# Patient Record
Sex: Female | Born: 1960 | Race: White | Hispanic: No | Marital: Single | Smoking: Never smoker
Health system: Southern US, Community
[De-identification: ages and names within clinical notes are randomized; demographics above are authoritative.]

## PROBLEM LIST (undated history)

## (undated) DIAGNOSIS — L570 Actinic keratosis: Secondary | ICD-10-CM

## (undated) DIAGNOSIS — F32A Depression, unspecified: Secondary | ICD-10-CM

## (undated) DIAGNOSIS — E039 Hypothyroidism, unspecified: Secondary | ICD-10-CM

## (undated) DIAGNOSIS — F329 Major depressive disorder, single episode, unspecified: Secondary | ICD-10-CM

## (undated) DIAGNOSIS — E785 Hyperlipidemia, unspecified: Secondary | ICD-10-CM

## (undated) DIAGNOSIS — F419 Anxiety disorder, unspecified: Secondary | ICD-10-CM

## (undated) DIAGNOSIS — M1990 Unspecified osteoarthritis, unspecified site: Secondary | ICD-10-CM

## (undated) HISTORY — DX: Actinic keratosis: L57.0

---

## 2004-12-30 ENCOUNTER — Ambulatory Visit: Payer: Self-pay

## 2005-01-06 ENCOUNTER — Ambulatory Visit: Payer: Self-pay | Admitting: *Deleted

## 2006-01-04 ENCOUNTER — Ambulatory Visit: Payer: Self-pay

## 2007-01-26 ENCOUNTER — Ambulatory Visit: Payer: Self-pay

## 2007-02-02 ENCOUNTER — Ambulatory Visit: Payer: Self-pay

## 2008-02-14 ENCOUNTER — Ambulatory Visit: Payer: Self-pay

## 2008-12-10 ENCOUNTER — Ambulatory Visit: Payer: Self-pay | Admitting: Specialist

## 2009-02-17 ENCOUNTER — Ambulatory Visit: Payer: Self-pay

## 2009-05-26 ENCOUNTER — Emergency Department: Payer: Self-pay | Admitting: Emergency Medicine

## 2010-04-09 ENCOUNTER — Ambulatory Visit: Payer: Self-pay

## 2011-05-12 ENCOUNTER — Ambulatory Visit: Payer: Self-pay

## 2011-08-02 ENCOUNTER — Ambulatory Visit: Payer: Self-pay | Admitting: Gastroenterology

## 2012-06-15 ENCOUNTER — Ambulatory Visit: Payer: Self-pay

## 2013-06-26 ENCOUNTER — Ambulatory Visit: Payer: Self-pay

## 2014-07-16 ENCOUNTER — Ambulatory Visit: Payer: Self-pay

## 2015-07-10 ENCOUNTER — Other Ambulatory Visit: Payer: Self-pay | Admitting: Obstetrics and Gynecology

## 2015-07-10 DIAGNOSIS — Z1231 Encounter for screening mammogram for malignant neoplasm of breast: Secondary | ICD-10-CM

## 2015-07-23 ENCOUNTER — Ambulatory Visit
Admission: RE | Admit: 2015-07-23 | Discharge: 2015-07-23 | Disposition: A | Discharge status: Home / Self Care | Payer: Managed Care, Other (non HMO) | Source: Ambulatory Visit | Attending: Obstetrics and Gynecology | Admitting: Obstetrics and Gynecology

## 2015-07-23 DIAGNOSIS — Z1231 Encounter for screening mammogram for malignant neoplasm of breast: Secondary | ICD-10-CM

## 2016-07-06 ENCOUNTER — Other Ambulatory Visit: Payer: Self-pay | Admitting: Obstetrics and Gynecology

## 2016-07-06 DIAGNOSIS — Z1231 Encounter for screening mammogram for malignant neoplasm of breast: Secondary | ICD-10-CM

## 2016-07-13 ENCOUNTER — Ambulatory Visit
Admission: RE | Admit: 2016-07-13 | Discharge: 2016-07-13 | Disposition: A | Discharge status: Home / Self Care | Payer: Managed Care, Other (non HMO) | Source: Ambulatory Visit | Attending: Otolaryngology | Admitting: Otolaryngology

## 2016-07-13 ENCOUNTER — Other Ambulatory Visit: Payer: Self-pay | Admitting: Otolaryngology

## 2016-07-13 DIAGNOSIS — R05 Cough: Secondary | ICD-10-CM | POA: Diagnosis not present

## 2016-07-13 DIAGNOSIS — R918 Other nonspecific abnormal finding of lung field: Secondary | ICD-10-CM | POA: Diagnosis not present

## 2016-07-13 DIAGNOSIS — R059 Cough, unspecified: Secondary | ICD-10-CM

## 2016-07-20 ENCOUNTER — Ambulatory Visit
Admission: RE | Admit: 2016-07-20 | Discharge: 2016-07-20 | Disposition: A | Discharge status: Home / Self Care | Payer: Managed Care, Other (non HMO) | Source: Ambulatory Visit | Attending: Otolaryngology | Admitting: Otolaryngology

## 2016-07-20 ENCOUNTER — Other Ambulatory Visit: Payer: Self-pay | Admitting: Otolaryngology

## 2016-07-20 DIAGNOSIS — R05 Cough: Secondary | ICD-10-CM | POA: Insufficient documentation

## 2016-07-20 DIAGNOSIS — R059 Cough, unspecified: Secondary | ICD-10-CM

## 2016-07-26 ENCOUNTER — Ambulatory Visit
Admission: RE | Admit: 2016-07-26 | Discharge: 2016-07-26 | Disposition: A | Discharge status: Home / Self Care | Payer: Managed Care, Other (non HMO) | Source: Ambulatory Visit | Attending: Obstetrics and Gynecology | Admitting: Obstetrics and Gynecology

## 2016-07-26 DIAGNOSIS — Z1231 Encounter for screening mammogram for malignant neoplasm of breast: Secondary | ICD-10-CM | POA: Diagnosis not present

## 2016-11-25 ENCOUNTER — Other Ambulatory Visit: Payer: Self-pay | Admitting: Orthopedic Surgery

## 2016-11-25 DIAGNOSIS — S56011A Strain of flexor muscle, fascia and tendon of right thumb at forearm level, initial encounter: Secondary | ICD-10-CM

## 2016-12-06 ENCOUNTER — Ambulatory Visit
Admission: RE | Admit: 2016-12-06 | Discharge: 2016-12-06 | Disposition: A | Discharge status: Home / Self Care | Payer: Managed Care, Other (non HMO) | Source: Ambulatory Visit | Attending: Orthopedic Surgery | Admitting: Orthopedic Surgery

## 2016-12-06 DIAGNOSIS — X58XXXA Exposure to other specified factors, initial encounter: Secondary | ICD-10-CM | POA: Diagnosis not present

## 2016-12-06 DIAGNOSIS — S56011A Strain of flexor muscle, fascia and tendon of right thumb at forearm level, initial encounter: Secondary | ICD-10-CM

## 2016-12-06 DIAGNOSIS — S56211A Strain of other flexor muscle, fascia and tendon at forearm level, right arm, initial encounter: Secondary | ICD-10-CM | POA: Diagnosis not present

## 2017-01-07 ENCOUNTER — Other Ambulatory Visit: Payer: Self-pay | Admitting: Gastroenterology

## 2017-01-07 DIAGNOSIS — R1310 Dysphagia, unspecified: Secondary | ICD-10-CM

## 2017-01-12 ENCOUNTER — Ambulatory Visit: Payer: Managed Care, Other (non HMO)

## 2017-01-14 ENCOUNTER — Ambulatory Visit
Admission: RE | Admit: 2017-01-14 | Discharge: 2017-01-14 | Disposition: A | Discharge status: Home / Self Care | Payer: Managed Care, Other (non HMO) | Source: Ambulatory Visit | Attending: Gastroenterology | Admitting: Gastroenterology

## 2017-01-14 DIAGNOSIS — R1310 Dysphagia, unspecified: Secondary | ICD-10-CM | POA: Insufficient documentation

## 2017-04-07 ENCOUNTER — Encounter: Payer: Self-pay | Admitting: *Deleted

## 2017-04-08 ENCOUNTER — Encounter
Admission: RE | Disposition: A | Discharge status: Home / Self Care | Payer: Self-pay | Source: Ambulatory Visit | Attending: Gastroenterology

## 2017-04-08 ENCOUNTER — Ambulatory Visit: Payer: Managed Care, Other (non HMO) | Admitting: Anesthesiology

## 2017-04-08 ENCOUNTER — Ambulatory Visit
Admission: RE | Admit: 2017-04-08 | Discharge: 2017-04-08 | Disposition: A | Discharge status: Home / Self Care | Payer: Managed Care, Other (non HMO) | Source: Ambulatory Visit | Attending: Gastroenterology | Admitting: Gastroenterology

## 2017-04-08 ENCOUNTER — Encounter: Payer: Self-pay | Admitting: *Deleted

## 2017-04-08 DIAGNOSIS — Z8371 Family history of colonic polyps: Secondary | ICD-10-CM | POA: Insufficient documentation

## 2017-04-08 DIAGNOSIS — E785 Hyperlipidemia, unspecified: Secondary | ICD-10-CM | POA: Diagnosis not present

## 2017-04-08 DIAGNOSIS — Z1211 Encounter for screening for malignant neoplasm of colon: Secondary | ICD-10-CM | POA: Insufficient documentation

## 2017-04-08 DIAGNOSIS — K2970 Gastritis, unspecified, without bleeding: Secondary | ICD-10-CM | POA: Insufficient documentation

## 2017-04-08 DIAGNOSIS — E039 Hypothyroidism, unspecified: Secondary | ICD-10-CM | POA: Diagnosis not present

## 2017-04-08 DIAGNOSIS — R1310 Dysphagia, unspecified: Secondary | ICD-10-CM | POA: Diagnosis not present

## 2017-04-08 DIAGNOSIS — K2210 Ulcer of esophagus without bleeding: Secondary | ICD-10-CM | POA: Diagnosis not present

## 2017-04-08 DIAGNOSIS — Z79899 Other long term (current) drug therapy: Secondary | ICD-10-CM | POA: Diagnosis not present

## 2017-04-08 HISTORY — DX: Hyperlipidemia, unspecified: E78.5

## 2017-04-08 HISTORY — DX: Depression, unspecified: F32.A

## 2017-04-08 HISTORY — DX: Unspecified osteoarthritis, unspecified site: M19.90

## 2017-04-08 HISTORY — DX: Anxiety disorder, unspecified: F41.9

## 2017-04-08 HISTORY — PX: COLONOSCOPY WITH PROPOFOL: SHX5780

## 2017-04-08 HISTORY — DX: Hypothyroidism, unspecified: E03.9

## 2017-04-08 HISTORY — PX: ESOPHAGOGASTRODUODENOSCOPY (EGD) WITH PROPOFOL: SHX5813

## 2017-04-08 HISTORY — DX: Major depressive disorder, single episode, unspecified: F32.9

## 2017-04-08 SURGERY — COLONOSCOPY WITH PROPOFOL
Anesthesia: General

## 2017-04-08 MED ORDER — SODIUM CHLORIDE 0.9 % IV SOLN
INTRAVENOUS | Status: DC
  Administered 2017-04-08 (×2): via INTRAVENOUS

## 2017-04-08 MED ORDER — MIDAZOLAM HCL 2 MG/2ML IJ SOLN
INTRAMUSCULAR | Status: DC | PRN
  Administered 2017-04-08: 2 mg via INTRAVENOUS

## 2017-04-08 MED ORDER — PHENYLEPHRINE HCL 10 MG/ML IJ SOLN
INTRAMUSCULAR | Status: DC | PRN
  Administered 2017-04-08: 150 ug via INTRAVENOUS
  Administered 2017-04-08 (×4): 100 ug via INTRAVENOUS
  Administered 2017-04-08: 50 ug via INTRAVENOUS
  Administered 2017-04-08 (×2): 100 ug via INTRAVENOUS

## 2017-04-08 MED ORDER — MIDAZOLAM HCL 2 MG/2ML IJ SOLN
INTRAMUSCULAR | Status: AC
  Filled 2017-04-08: qty 2

## 2017-04-08 MED ORDER — SODIUM CHLORIDE 0.9 % IV SOLN
INTRAVENOUS | Status: DC
Start: 2017-04-08 — End: 2017-04-08

## 2017-04-08 MED ORDER — PROPOFOL 10 MG/ML IV BOLUS
INTRAVENOUS | Status: DC | PRN
  Administered 2017-04-08: 400 mg via INTRAVENOUS

## 2017-04-08 MED ORDER — FENTANYL CITRATE (PF) 100 MCG/2ML IJ SOLN
INTRAMUSCULAR | Status: AC
Start: 2017-04-08 — End: 2017-04-08
  Filled 2017-04-08: qty 2

## 2017-04-08 MED ORDER — PROPOFOL 500 MG/50ML IV EMUL
INTRAVENOUS | Status: DC | PRN
  Administered 2017-04-08: 150 ug/kg/min via INTRAVENOUS

## 2017-04-08 MED ORDER — PROPOFOL 10 MG/ML IV BOLUS
INTRAVENOUS | Status: AC
  Filled 2017-04-08: qty 20

## 2017-04-08 MED ORDER — FENTANYL CITRATE (PF) 100 MCG/2ML IJ SOLN
INTRAMUSCULAR | Status: DC | PRN
  Administered 2017-04-08 (×2): 50 ug via INTRAVENOUS

## 2017-04-08 MED ORDER — IPRATROPIUM-ALBUTEROL 0.5-2.5 (3) MG/3ML IN SOLN
3.0000 mL | Freq: Once | RESPIRATORY_TRACT | Status: AC
  Administered 2017-04-08: 3 mL via RESPIRATORY_TRACT
  Filled 2017-04-08: qty 3

## 2017-04-08 NOTE — Anesthesia Post-op Follow-up Note (Cosign Needed)
Anesthesia QCDR form completed.        

## 2017-04-08 NOTE — Op Note (Signed)
[facility] Gastroenterology Patient Name: Karina Williams Procedure Date: 04/08/2017 10:26 AM MRN: [id number] Account #: [id number] Date of Birth: April 11, 1961 Admit Type: Outpatient Age: 56 Room: [facility] ENDO ROOM 1 Gender: Female Note Status: Finalized Procedure:            Colonoscopy Indications:          Family history of colonic polyps in a first-degree                        relative Providers:            Lollie Sails, MD Referring MD:         Dion Body (Referring MD) Medicines:            Monitored Anesthesia Care Complications:        No immediate complications. Procedure:            Pre-Anesthesia Assessment:                       - ASA Grade Assessment: II - A patient with mild                        systemic disease.                       After obtaining informed consent, the colonoscope was                        passed under direct vision. Throughout the procedure,                        the patient's blood pressure, pulse, and oxygen                        saturations were monitored continuously. The Olympus                        PCF-H180AL colonoscope ( S#: [id number] ) was introduced                        through the anus and advanced to the the cecum,                        identified by appendiceal orifice and ileocecal valve.                        The quality of the bowel preparation was good. The                        colonoscopy was performed without difficulty. The                        patient tolerated the procedure well. Findings:      The colon (entire examined portion) appeared normal.      The retroflexed view of the distal rectum and anal verge was normal and       showed no anal or rectal abnormalities.      The digital rectal exam was normal. Impression:           - The entire examined colon is normal.                       -  The distal rectum and anal verge are normal on                        retroflexion view.                   - No specimens collected. Recommendation:       - Discharge patient to home.                       - Advance diet as tolerated.                       - Repeat colonoscopy in 5 years for screening purposes. Procedure Code(s):    --- Professional ---                       [phone], Colonoscopy, flexible; diagnostic, including                        collection of specimen(s) by brushing or washing, when                        performed (separate procedure) Diagnosis Code(s):    --- Professional ---                       Z83.71, Family history of colonic polyps CPT copyright 2016 American Medical Association. All rights reserved. The codes documented in this report are preliminary and upon coder review may  be revised to meet current compliance requirements. Lollie Sails, MD 04/08/2017 11:25:46 AM This report has been signed electronically. Number of Addenda: 0 Note Initiated On: 04/08/2017 10:26 AM Scope Withdrawal Time: 0 hours 7 minutes 51 seconds  Total Procedure Duration: 0 hours 15 minutes 36 seconds       [facility]

## 2017-04-08 NOTE — Anesthesia Preprocedure Evaluation (Addendum)
Anesthesia Evaluation  Patient identified by MRN, date of birth, ID band Patient awake    Reviewed: Allergy & Precautions, H&P , NPO status , Patient's Chart, lab work & pertinent test results  History of Anesthesia Complications Negative for: history of anesthetic complications  Airway Mallampati: III  TM Distance: >3 FB Neck ROM: full    Dental  (+) Caps, Chipped   Pulmonary neg pulmonary ROS, neg shortness of breath,    Pulmonary exam normal breath sounds clear to auscultation       Cardiovascular Exercise Tolerance: Good (-) angina(-) Past MI and (-) DOE negative cardio ROS Normal cardiovascular exam Rhythm:regular Rate:Normal     Neuro/Psych PSYCHIATRIC DISORDERS Anxiety Depression negative neurological ROS  negative psych ROS   GI/Hepatic Neg liver ROS, GERD  Controlled,  Endo/Other  Hypothyroidism   Renal/GU negative Renal ROS  negative genitourinary   Musculoskeletal  (+) Arthritis ,   Abdominal   Peds  Hematology negative hematology ROS (+)   Anesthesia Other Findings Past Medical History: No date: Anxiety No date: Arthritis No date: Depression No date: Hyperlipidemia No date: Hypothyroidism  History reviewed. No pertinent surgical history.  BMI    Body Mass Index:  21.56 kg/m      Reproductive/Obstetrics negative OB ROS                            Anesthesia Physical Anesthesia Plan  ASA: III  Anesthesia Plan: General   Post-op Pain Management:    Induction:   Airway Management Planned:   Additional Equipment:   Intra-op Plan:   Post-operative Plan:   Informed Consent: I have reviewed the patients History and Physical, chart, labs and discussed the procedure including the risks, benefits and alternatives for the proposed anesthesia with the patient or authorized representative who has indicated his/her understanding and acceptance.   Dental Advisory  Given  Plan Discussed with: Anesthesiologist, CRNA and Surgeon  Anesthesia Plan Comments:         Anesthesia Quick Evaluation

## 2017-04-08 NOTE — Anesthesia Postprocedure Evaluation (Signed)
Anesthesia Post Note  Patient: Karina Williams  Procedure(s) Performed: Procedure(s) (LRB): COLONOSCOPY WITH PROPOFOL (N/A) ESOPHAGOGASTRODUODENOSCOPY (EGD) WITH PROPOFOL (N/A)  Patient location during evaluation: Endoscopy Anesthesia Type: General Level of consciousness: awake and alert Pain management: pain level controlled Vital Signs Assessment: post-procedure vital signs reviewed and stable Respiratory status: spontaneous breathing, nonlabored ventilation, respiratory function stable and patient connected to nasal cannula oxygen Cardiovascular status: blood pressure returned to baseline and stable Postop Assessment: no signs of nausea or vomiting Anesthetic complications: no     Last Vitals:  Vitals:   04/08/17 1152 04/08/17 1202  BP: 91/62 (!) 107/52  Pulse: 69 64  Resp: (!) 22 17  Temp:      Last Pain:  Vitals:   04/08/17 1132  TempSrc:   PainSc: 0-No pain                 Precious Haws Piscitello

## 2017-04-08 NOTE — H&P (Signed)
Outpatient short stay form Pre-procedure 04/08/2017 10:25 AM Karina Sails MD  Primary Physician: Dr Dion Body  Reason for visit:  EGD and colonoscopy  History of present illness:  Patient is a 56 year old female presenting today as above. There is family history of colon polyps and a primary relative. She has had a colonoscopy which was negative for this in the past. She also has some issues with dysphagia. This is more so a globus sensation. She has had a barium swallow done on 01/14/2017 that showed the cervical esophagus to be widely patent thoracic esophagus is widely patent no evidence of reflux or focal abnormality and a standardized tablet passed without hang up. She states that she may have some anxiety at times. He does not take medications for this. He tolerated her prep well. She takes no aspirin or blood thinning agents. She states she has had some difficulty lately with getting her thyroid medications adjusted.    Current Facility-Administered Medications:  .  0.9 %  sodium chloride infusion, , Intravenous, Continuous, Karina Sails, MD, Last Rate: 20 mL/hr at 04/08/17 1021 .  0.9 %  sodium chloride infusion, , Intravenous, Continuous, Karina Sails, MD  Prescriptions Prior to Admission  Medication Sig Dispense Refill Last Dose  . atorvastatin (LIPITOR) 10 MG tablet Take 10 mg by mouth daily.   04/07/2017 at Unknown time  . levothyroxine (SYNTHROID, LEVOTHROID) 88 MCG tablet Take 88 mcg by mouth daily before breakfast.   04/07/2017 at Unknown time  . Misc Natural Products (GLUCOSAMINE-CHONDROITIN SULF PO) Take by mouth.   04/07/2017 at Unknown time  . Multiple Vitamin (MULTIVITAMIN) tablet Take 1 tablet by mouth daily.   Past Week at Unknown time  . albuterol (PROVENTIL HFA;VENTOLIN HFA) 108 (90 Base) MCG/ACT inhaler Inhale 2 puffs into the lungs every 4 (four) hours as needed for wheezing or shortness of breath.   Not Taking at Unknown time  . azelastine  (ASTELIN) 0.1 % nasal spray Place 2 sprays into both nostrils Nightly. Use in each nostril as directed   Not Taking at Unknown time  . estradiol (ESTRACE) 1 MG tablet Take 1 mg by mouth daily.   Not Taking at Unknown time  . medroxyPROGESTERone (PROVERA) 5 MG tablet Take 5 mg by mouth daily.   Not Taking at Unknown time  . ranitidine (ZANTAC) 150 MG tablet Take 150 mg by mouth at bedtime.   Not Taking at Unknown time     No Known Allergies   Past Medical History:  Diagnosis Date  . Anxiety   . Arthritis   . Depression   . Hyperlipidemia   . Hypothyroidism     Review of systems:      Physical Exam    Heart and lungs: Regular rate and rhythm without rub or gallop, lungs are bilaterally clear.    HEENT: Normocephalic atraumatic eyes are anicteric    Other:     Pertinant exam for procedure: Soft nontender nondistended bowel sounds positive normoactive.   Planned proceedures: Colonoscopy and indicated procedures. I have discussed the risks benefits and complications of procedures to include not limited to bleeding, infection, perforation and the risk of sedation and the patient wishes to proceed.    Karina Sails, MD Gastroenterology 04/08/2017  10:25 AM

## 2017-04-08 NOTE — Addendum Note (Signed)
Addendum  created 04/08/17 1333 by Andria Frames, MD   Anesthesia Attestations filed

## 2017-04-08 NOTE — Op Note (Signed)
[facility] Gastroenterology Patient Name: Karina Williams Procedure Date: 04/08/2017 10:26 AM MRN: [id number] Account #: [id number] Date of Birth: [birth year] Admit Type: Outpatient Age: 56 Room: [facility] ENDO ROOM 1 Gender: Female Note Status: Finalized Procedure:            Upper GI endoscopy Indications:          Dysphagia Providers:            Lollie Sails, MD Referring MD:         Dion Body (Referring MD) Medicines:            Monitored Anesthesia Care Complications:        No immediate complications. Procedure:            Pre-Anesthesia Assessment:                       - ASA Grade Assessment: II - A patient with mild                        systemic disease.                       After obtaining informed consent, the endoscope was                        passed under direct vision. Throughout the procedure,                        the patient's blood pressure, pulse, and oxygen                        saturations were monitored continuously. The Endoscope                        was introduced through the mouth, and advanced to the                        third part of duodenum. The upper GI endoscopy was                        accomplished without difficulty. The patient tolerated                        the procedure well. Findings:      LA Grade B (one or more mucosal breaks greater than 5 mm, not extending       between the tops of two mucosal folds) esophagitis with no bleeding was       found.      The exam of the esophagus was otherwise normal.      Diffuse mildly erythematous mucosa without bleeding was found in the       gastric body. Biopsies were taken with a cold forceps for histology.      The cardia and gastric fundus were normal on retroflexion.      The examined duodenum was normal. Impression:           - LA Grade B erosive esophagitis.                       - Erythematous mucosa in the gastric body. Biopsied.                       -  Normal examined duodenum. Recommendation:       - Perform a colonoscopy today.                       - Use Protonix (pantoprazole) 40 mg PO daily for 3                        months. Procedure Code(s):    --- Professional ---                       [phone], Esophagogastroduodenoscopy, flexible, transoral;                        with biopsy, single or multiple Diagnosis Code(s):    --- Professional ---                       K20.8, Other esophagitis                       K31.89, Other diseases of stomach and duodenum                       R13.10, Dysphagia, unspecified CPT copyright 2016 American Medical Association. All rights reserved. The codes documented in this report are preliminary and upon coder review may  be revised to meet current compliance requirements. Lollie Sails, MD 04/08/2017 10:58:31 AM This report has been signed electronically. Number of Addenda: 0 Note Initiated On: 04/08/2017 10:26 AM      [facility]

## 2017-04-08 NOTE — Transfer of Care (Signed)
Immediate Anesthesia Transfer of Care Note  Patient: Karina Williams  Procedure(s) Performed: Procedure(s): COLONOSCOPY WITH PROPOFOL (N/A) ESOPHAGOGASTRODUODENOSCOPY (EGD) WITH PROPOFOL (N/A)  Patient Location: PACU  Anesthesia Type:General  Level of Consciousness: awake  Airway & Oxygen Therapy: Patient Spontanous Breathing and Patient connected to nasal cannula oxygen  Post-op Assessment: Report given to RN and Post -op Vital signs reviewed and stable  Post vital signs: Reviewed and stable  Last Vitals:  Vitals:   04/08/17 0957  BP: 111/70  Resp: 16  Temp: 36.3 C    Last Pain:  Vitals:   04/08/17 0957  TempSrc: Tympanic         Complications: No apparent anesthesia complications

## 2017-04-11 ENCOUNTER — Encounter: Payer: Self-pay | Admitting: Gastroenterology

## 2017-04-11 LAB — SURGICAL PATHOLOGY

## 2017-08-10 ENCOUNTER — Other Ambulatory Visit: Payer: Self-pay | Admitting: Obstetrics and Gynecology

## 2017-08-10 DIAGNOSIS — Z1231 Encounter for screening mammogram for malignant neoplasm of breast: Secondary | ICD-10-CM

## 2017-08-25 ENCOUNTER — Ambulatory Visit
Admission: RE | Admit: 2017-08-25 | Discharge: 2017-08-25 | Disposition: A | Discharge status: Home / Self Care | Payer: Managed Care, Other (non HMO) | Source: Ambulatory Visit | Attending: Obstetrics and Gynecology | Admitting: Obstetrics and Gynecology

## 2017-08-25 DIAGNOSIS — Z1231 Encounter for screening mammogram for malignant neoplasm of breast: Secondary | ICD-10-CM | POA: Insufficient documentation

## 2018-07-24 ENCOUNTER — Other Ambulatory Visit: Payer: Self-pay | Admitting: Obstetrics and Gynecology

## 2018-07-24 DIAGNOSIS — Z1231 Encounter for screening mammogram for malignant neoplasm of breast: Secondary | ICD-10-CM

## 2018-09-15 ENCOUNTER — Ambulatory Visit
Admission: RE | Admit: 2018-09-15 | Discharge: 2018-09-15 | Disposition: A | Discharge status: Home / Self Care | Payer: Managed Care, Other (non HMO) | Source: Ambulatory Visit | Attending: Obstetrics and Gynecology | Admitting: Obstetrics and Gynecology

## 2018-09-15 DIAGNOSIS — Z1231 Encounter for screening mammogram for malignant neoplasm of breast: Secondary | ICD-10-CM | POA: Diagnosis present

## 2019-07-26 ENCOUNTER — Other Ambulatory Visit: Payer: Self-pay | Admitting: Obstetrics and Gynecology

## 2019-07-26 DIAGNOSIS — Z1231 Encounter for screening mammogram for malignant neoplasm of breast: Secondary | ICD-10-CM

## 2019-09-19 ENCOUNTER — Ambulatory Visit
Admission: RE | Admit: 2019-09-19 | Discharge: 2019-09-19 | Disposition: A | Discharge status: Home / Self Care | Payer: 59 | Source: Ambulatory Visit | Attending: Obstetrics and Gynecology | Admitting: Obstetrics and Gynecology

## 2019-09-19 DIAGNOSIS — Z1231 Encounter for screening mammogram for malignant neoplasm of breast: Secondary | ICD-10-CM | POA: Insufficient documentation

## 2020-07-16 ENCOUNTER — Ambulatory Visit (INDEPENDENT_AMBULATORY_CARE_PROVIDER_SITE_OTHER): Payer: 59 | Admitting: Dermatology

## 2020-07-16 ENCOUNTER — Other Ambulatory Visit: Payer: Self-pay

## 2020-07-16 DIAGNOSIS — D229 Melanocytic nevi, unspecified: Secondary | ICD-10-CM | POA: Diagnosis not present

## 2020-07-16 DIAGNOSIS — D1800 Hemangioma unspecified site: Secondary | ICD-10-CM | POA: Diagnosis not present

## 2020-07-16 DIAGNOSIS — L814 Other melanin hyperpigmentation: Secondary | ICD-10-CM

## 2020-07-16 DIAGNOSIS — L578 Other skin changes due to chronic exposure to nonionizing radiation: Secondary | ICD-10-CM

## 2020-07-16 DIAGNOSIS — L918 Other hypertrophic disorders of the skin: Secondary | ICD-10-CM

## 2020-07-16 DIAGNOSIS — L821 Other seborrheic keratosis: Secondary | ICD-10-CM

## 2020-07-16 DIAGNOSIS — L738 Other specified follicular disorders: Secondary | ICD-10-CM

## 2020-07-16 DIAGNOSIS — Z1283 Encounter for screening for malignant neoplasm of skin: Secondary | ICD-10-CM

## 2020-07-16 NOTE — Patient Instructions (Addendum)

## 2020-07-16 NOTE — Progress Notes (Signed)
   Follow-Up Visit   Subjective  Karina Williams is a 59 y.o. female who presents for the following: Annual Exam (TBSE, Hx of Aks ). The patient presents for Total-Body Skin Exam (TBSE) for skin cancer screening and mole check.  The following portions of the chart were reviewed this encounter and updated as appropriate:  Tobacco  Allergies  Meds  Problems  Med Hx  Surg Hx  Fam Hx     Review of Systems:  No other skin or systemic complaints except as noted in HPI or Assessment and Plan.  Objective  Well appearing patient in no apparent distress; mood and affect are within normal limits.  A full examination was performed including scalp, head, eyes, ears, nose, lips, neck, chest, axillae, abdomen, back, buttocks, bilateral upper extremities, bilateral lower extremities, hands, feet, fingers, toes, fingernails, and toenails. All findings within normal limits unless otherwise noted below.  Objective  R lower eyelid: Small yellow papules with a central dell.    Assessment & Plan    Lentigines - Scattered tan macules - Discussed due to sun exposure - Benign, observe - Call for any changes  Seborrheic Keratoses - Stuck-on, waxy, tan-brown papules and plaques  - Discussed benign etiology and prognosis. - Observe - Call for any changes  Melanocytic Nevi - Tan-brown and/or pink-flesh-colored symmetric macules and papules - Benign appearing on exam today - Observation - Call clinic for new or changing moles - Recommend daily use of broad spectrum spf 30+ sunscreen to sun-exposed areas.   Hemangiomas - Red papules - Discussed benign nature - Observe - Call for any changes  Actinic Damage - diffuse scaly erythematous macules with underlying dyspigmentation - Recommend daily broad spectrum sunscreen SPF 30+ to sun-exposed areas, reapply every 2 hours as needed.  - Call for new or changing lesions.  Skin cancer screening performed today.  Acrochordons (Skin Tags) -  Fleshy, skin-colored pedunculated papules - Benign appearing.  - Observe. - If desired, they can be removed with an in office procedure that is not covered by insurance. - Please call the clinic if you notice any new or changing lesions.  Sebaceous hyperplasia R lower eyelid  Observe   Skin cancer screening  Return in about 1 year (around 07/16/2021) for TBSE.  IMarye Round, CMA, am acting as scribe for Sarina Ser, MD .  Documentation: I have reviewed the above documentation for accuracy and completeness, and I agree with the above.  Sarina Ser, MD

## 2020-07-19 ENCOUNTER — Encounter: Payer: Self-pay | Admitting: Dermatology

## 2020-08-19 ENCOUNTER — Other Ambulatory Visit: Payer: Self-pay | Admitting: Obstetrics and Gynecology

## 2020-08-19 DIAGNOSIS — Z1231 Encounter for screening mammogram for malignant neoplasm of breast: Secondary | ICD-10-CM

## 2020-09-19 ENCOUNTER — Other Ambulatory Visit: Payer: Self-pay

## 2020-09-19 ENCOUNTER — Ambulatory Visit
Admission: RE | Admit: 2020-09-19 | Discharge: 2020-09-19 | Disposition: A | Discharge status: Home / Self Care | Payer: 59 | Source: Ambulatory Visit | Attending: Obstetrics and Gynecology | Admitting: Obstetrics and Gynecology

## 2020-09-19 DIAGNOSIS — Z1231 Encounter for screening mammogram for malignant neoplasm of breast: Secondary | ICD-10-CM | POA: Diagnosis present

## 2021-07-22 ENCOUNTER — Other Ambulatory Visit: Payer: Self-pay

## 2021-07-22 ENCOUNTER — Encounter: Payer: Self-pay | Admitting: Dermatology

## 2021-07-22 ENCOUNTER — Ambulatory Visit (INDEPENDENT_AMBULATORY_CARE_PROVIDER_SITE_OTHER): Payer: 59 | Admitting: Dermatology

## 2021-07-22 DIAGNOSIS — D225 Melanocytic nevi of trunk: Secondary | ICD-10-CM | POA: Diagnosis not present

## 2021-07-22 DIAGNOSIS — L578 Other skin changes due to chronic exposure to nonionizing radiation: Secondary | ICD-10-CM

## 2021-07-22 DIAGNOSIS — Z1283 Encounter for screening for malignant neoplasm of skin: Secondary | ICD-10-CM | POA: Diagnosis not present

## 2021-07-22 DIAGNOSIS — D229 Melanocytic nevi, unspecified: Secondary | ICD-10-CM

## 2021-07-22 DIAGNOSIS — L814 Other melanin hyperpigmentation: Secondary | ICD-10-CM | POA: Diagnosis not present

## 2021-07-22 DIAGNOSIS — D485 Neoplasm of uncertain behavior of skin: Secondary | ICD-10-CM

## 2021-07-22 DIAGNOSIS — D1800 Hemangioma unspecified site: Secondary | ICD-10-CM

## 2021-07-22 DIAGNOSIS — L821 Other seborrheic keratosis: Secondary | ICD-10-CM

## 2021-07-22 NOTE — Progress Notes (Signed)
   Follow-Up Visit   Subjective  Karina Williams is a 60 y.o. female who presents for the following: Annual Exam (Mole of right inframammary - TBSE today). The patient presents for Total-Body Skin Exam (TBSE) for skin cancer screening and mole check.  The following portions of the chart were reviewed this encounter and updated as appropriate:   Tobacco  Allergies  Meds  Problems  Med Hx  Surg Hx  Fam Hx     Review of Systems:  No other skin or systemic complaints except as noted in HPI or Assessment and Plan.  Objective  Well appearing patient in no apparent distress; mood and affect are within normal limits.  A full examination was performed including scalp, head, eyes, ears, nose, lips, neck, chest, axillae, abdomen, back, buttocks, bilateral upper extremities, bilateral lower extremities, hands, feet, fingers, toes, fingernails, and toenails. All findings within normal limits unless otherwise noted below.  Right Inframammary Fold 1.1 cm brown to flesh colored papule   Assessment & Plan  Neoplasm of uncertain behavior of skin Right Inframammary Fold  Epidermal / dermal shaving  Lesion diameter (cm):  1.1 Informed consent: discussed and consent obtained   Timeout: patient name, date of birth, surgical site, and procedure verified   Procedure prep:  Patient was prepped and draped in usual sterile fashion Prep type:  Isopropyl alcohol Anesthesia: the lesion was anesthetized in a standard fashion   Anesthetic:  1% lidocaine w/ epinephrine 1-100,000 buffered w/ 8.4% NaHCO3 Instrument used: flexible razor blade   Hemostasis achieved with: pressure, aluminum chloride and electrodesiccation   Outcome: patient tolerated procedure well   Post-procedure details: sterile dressing applied and wound care instructions given   Dressing type: bandage and petrolatum    Specimen 1 - Surgical pathology Differential Diagnosis: Irritated nevus R/O dysplasia Check Margins: No  Skin  cancer screening  Lentigines - Scattered tan macules - Due to sun exposure - Benign-appering, observe - Recommend daily broad spectrum sunscreen SPF 30+ to sun-exposed areas, reapply every 2 hours as needed. - Call for any changes  Seborrheic Keratoses - Stuck-on, waxy, tan-brown papules and/or plaques  - Benign-appearing - Discussed benign etiology and prognosis. - Observe - Call for any changes  Melanocytic Nevi - Tan-brown and/or pink-flesh-colored symmetric macules and papules - Benign appearing on exam today - Observation - Call clinic for new or changing moles - Recommend daily use of broad spectrum spf 30+ sunscreen to sun-exposed areas.   Hemangiomas - Red papules - Discussed benign nature - Observe - Call for any changes  Actinic Damage - Chronic condition, secondary to cumulative UV/sun exposure - diffuse scaly erythematous macules with underlying dyspigmentation - Recommend daily broad spectrum sunscreen SPF 30+ to sun-exposed areas, reapply every 2 hours as needed.  - Staying in the shade or wearing long sleeves, sun glasses (UVA+UVB protection) and wide brim hats (4-inch brim around the entire circumference of the hat) are also recommended for sun protection.  - Call for new or changing lesions.  Skin cancer screening performed today.  Return if symptoms worsen or fail to improve.  I, Ashok Cordia, CMA, am acting as scribe for Sarina Ser, MD . Documentation: I have reviewed the above documentation for accuracy and completeness, and I agree with the above.  Sarina Ser, MD

## 2021-07-22 NOTE — Patient Instructions (Addendum)
Wound Care Instructions  Cleanse wound gently with soap and water once a day then pat dry with clean gauze. Apply a thing coat of Petrolatum (petroleum jelly, "Vaseline") over the wound (unless you have an allergy to this). We recommend that you use a new, sterile tube of Vaseline. Do not pick or remove scabs. Do not remove the yellow or white "healing tissue" from the base of the wound.  Cover the wound with fresh, clean, nonstick gauze and secure with paper tape. You may use Band-Aids in place of gauze and tape if the would is small enough, but would recommend trimming much of the tape off as there is often too much. Sometimes Band-Aids can irritate the skin.  You should call the office for your biopsy report after 1 week if you have not already been contacted.  If you experience any problems, such as abnormal amounts of bleeding, swelling, significant bruising, significant pain, or evidence of infection, please call the office immediately.  FOR ADULT SURGERY PATIENTS: If you need something for pain relief you may take 1 extra strength Tylenol (acetaminophen) AND 2 Ibuprofen (200mg each) together every 4 hours as needed for pain. (do not take these if you are allergic to them or if you have a reason you should not take them.) Typically, you may only need pain medication for 1 to 3 days.   If you have any questions or concerns for your doctor, please call our main line at [phone] and press option 4 to reach your doctor's medical assistant. If no one answers, please leave a voicemail as directed and we will return your call as soon as possible. Messages left after 4 pm will be answered the following business day.   You may also send us a message via MyChart. We typically respond to MyChart messages within 1-2 business days.  For prescription refills, please ask your pharmacy to contact our office. Our fax number is [phone].  If you have an urgent issue when the clinic is closed that  cannot wait until the next business day, you can page your doctor at the number below.    Please note that while we do our best to be available for urgent issues outside of office hours, we are not available 24/7.   If you have an urgent issue and are unable to reach us, you may choose to seek medical care at your doctor's office, retail clinic, urgent care center, or emergency room.  If you have a medical emergency, please immediately call 911 or go to the emergency department.  Pager Numbers  - Dr. Kowalski: [phone]  - Dr. Moye: [phone]  - Dr. Stewart: [phone]  In the event of inclement weather, please call our main line at [phone] for an update on the status of any delays or closures.  Dermatology Medication Tips: Please keep the boxes that topical medications come in in order to help keep track of the instructions about where and how to use these. Pharmacies typically print the medication instructions only on the boxes and not directly on the medication tubes.   If your medication is too expensive, please contact our office at [phone] option 4 or send us a message through MyChart.   We are unable to tell what your co-pay for medications will be in advance as this is different depending on your insurance coverage. However, we may be able to find a substitute medication at lower cost or fill out paperwork to get insurance to cover a needed   medication.   If a prior authorization is required to get your medication covered by your insurance company, please allow us 1-2 business days to complete this process.  Drug prices often vary depending on where the prescription is filled and some pharmacies may offer cheaper prices.  The website www.goodrx.com contains coupons for medications through different pharmacies. The prices here do not account for what the cost may be with help from insurance (it may be cheaper with your insurance), but the website can give you the  price if you did not use any insurance.  - You can print the associated coupon and take it with your prescription to the pharmacy.  - You may also stop by our office during regular business hours and pick up a GoodRx coupon card.  - If you need your prescription sent electronically to a different pharmacy, notify our office through [place] MyChart or by phone at [phone] option 4.   

## 2021-07-28 ENCOUNTER — Telehealth: Payer: Self-pay

## 2021-07-28 NOTE — Telephone Encounter (Signed)
Advised pt of bx results/sh ?

## 2021-07-28 NOTE — Telephone Encounter (Signed)
-----   Message from Ralene Bathe, MD sent at 07/28/2021 12:17 PM EDT ----- Diagnosis Skin , right inframammary fold MELANOCYTIC NEVUS, INTRADERMAL TYPE, BASE INVOLVED  Benign mole No further treatment needed

## 2021-09-04 ENCOUNTER — Other Ambulatory Visit: Payer: Self-pay | Admitting: Family Medicine

## 2021-09-04 DIAGNOSIS — Z1231 Encounter for screening mammogram for malignant neoplasm of breast: Secondary | ICD-10-CM

## 2021-09-21 ENCOUNTER — Ambulatory Visit
Admission: RE | Admit: 2021-09-21 | Discharge: 2021-09-21 | Disposition: A | Discharge status: Home / Self Care | Payer: 59 | Source: Ambulatory Visit | Attending: Family Medicine | Admitting: Family Medicine

## 2021-09-21 ENCOUNTER — Other Ambulatory Visit: Payer: Self-pay

## 2021-09-21 DIAGNOSIS — Z1231 Encounter for screening mammogram for malignant neoplasm of breast: Secondary | ICD-10-CM | POA: Diagnosis not present

## 2022-04-23 IMAGING — MG MM DIGITAL SCREENING BILAT W/ TOMO AND CAD
8 series · 9 of 24 positions shown · non-contrast
Comparison: Previous exam(s).

CLINICAL DATA: Screening.

EXAM:
DIGITAL SCREENING BILATERAL MAMMOGRAM WITH TOMOSYNTHESIS AND CAD
TECHNIQUE: Bilateral screening digital craniocaudal and mediolateral oblique
mammograms were obtained. Bilateral screening digital breast
tomosynthesis was performed. The images were evaluated with
computer-aided detection.

[L CC synth-2D]
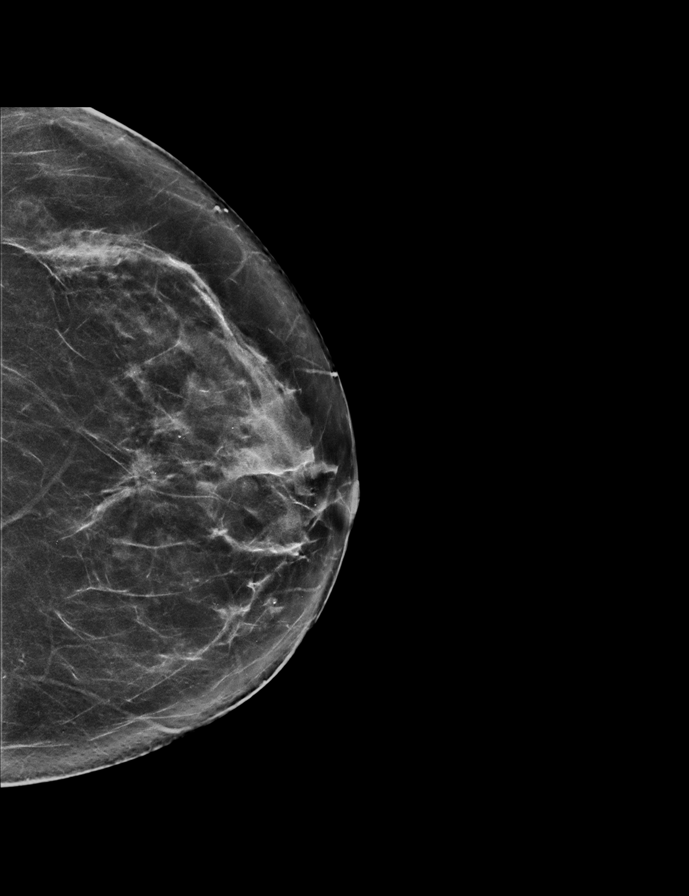

[L MLO synth-2D]
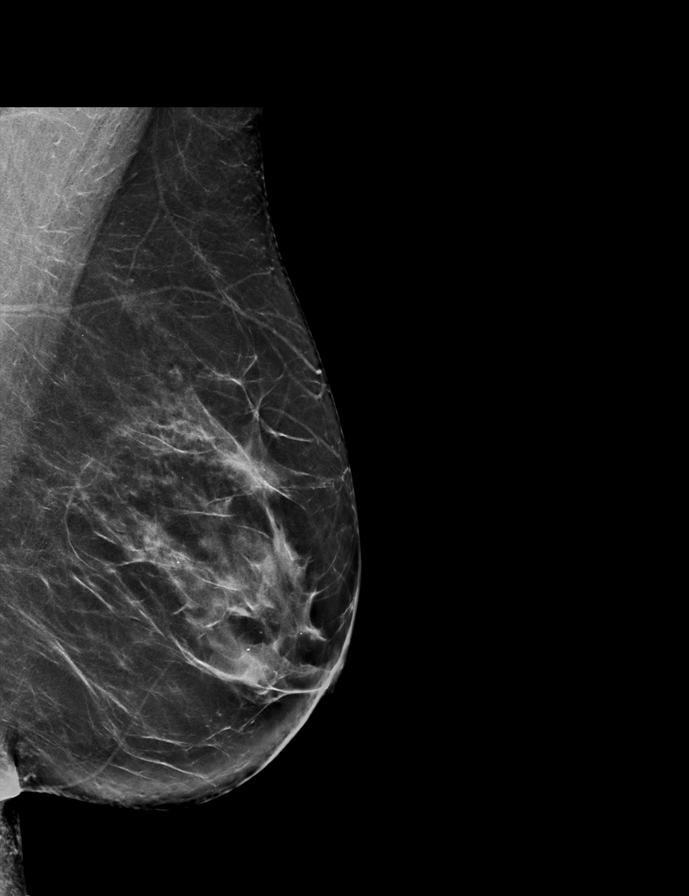

[R CC synth-2D]
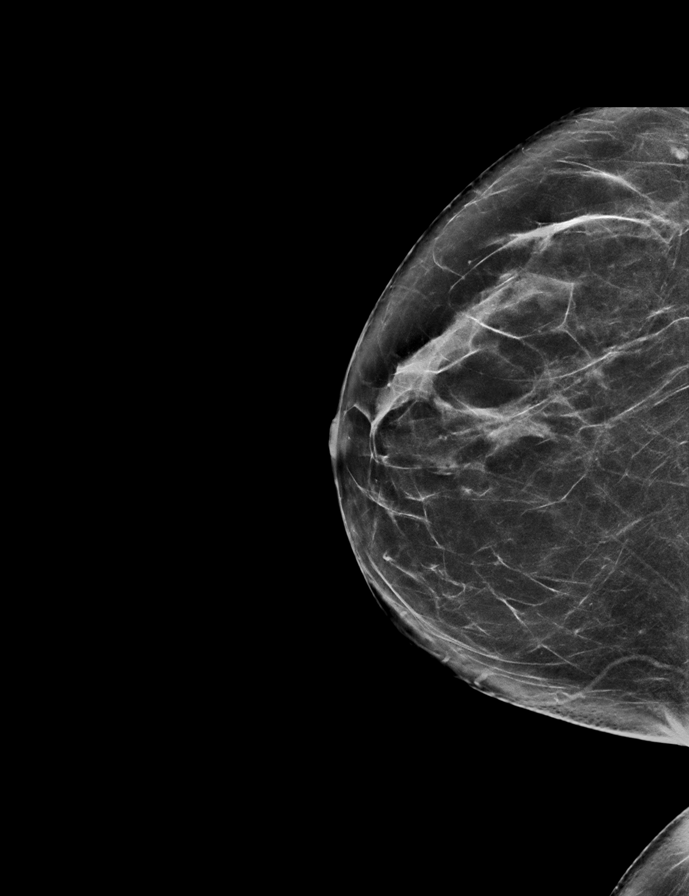

[R MLO synth-2D]
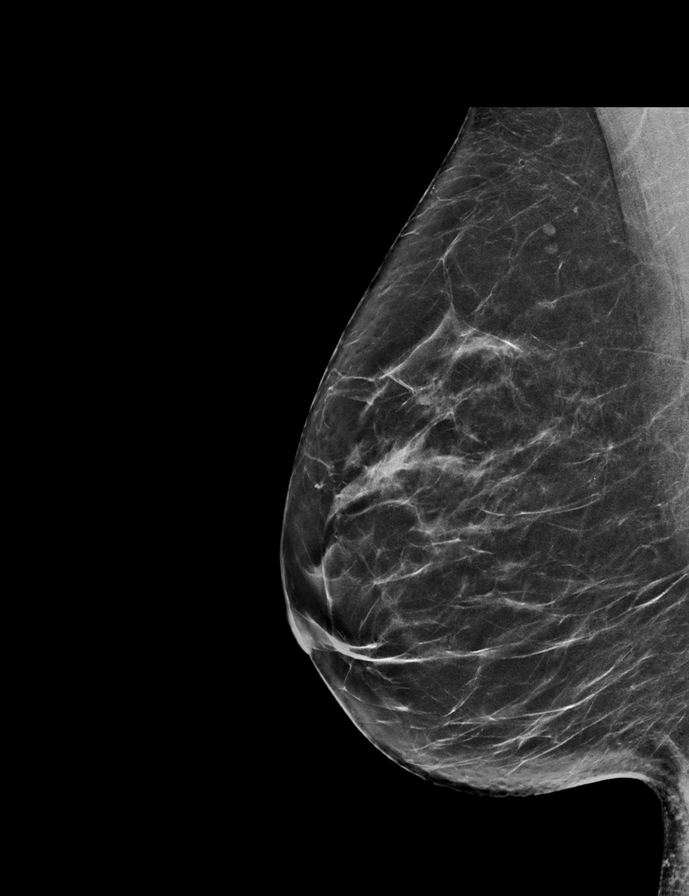

[R MLO tomo · 2 of 64 frames shown]
[frame 21/64]
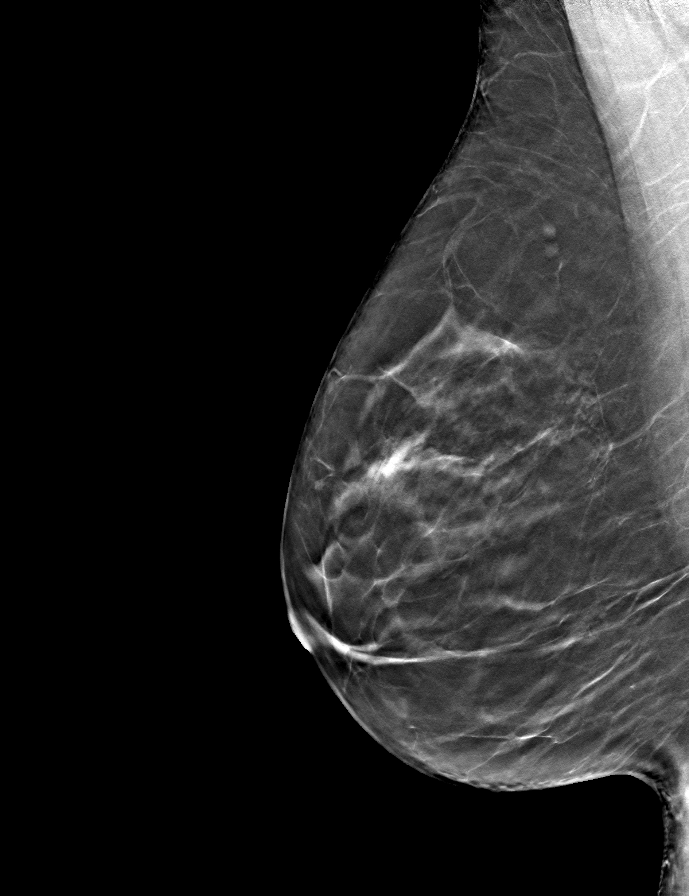
[frame 33/64]
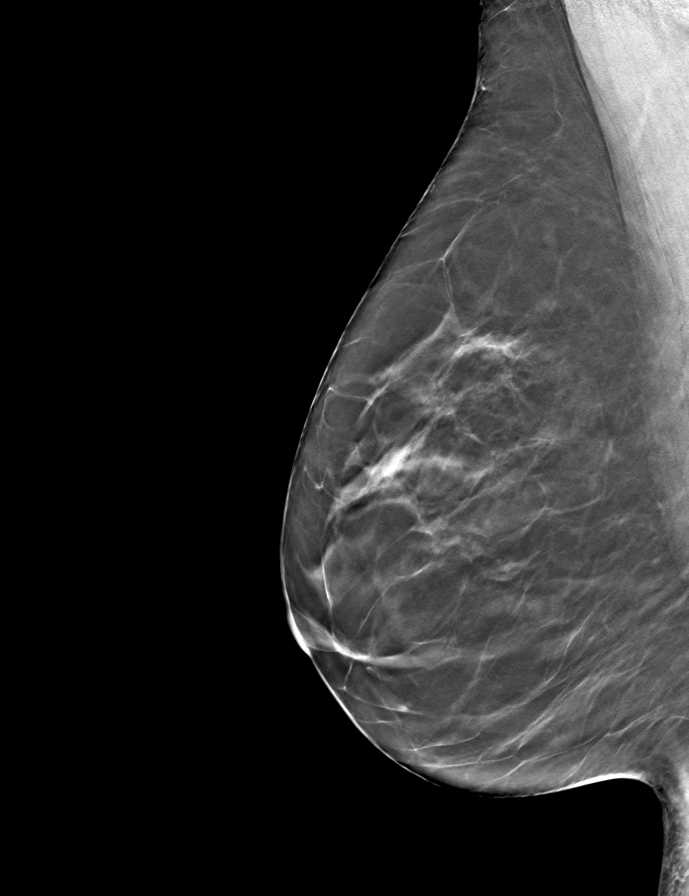

[L MLO tomo · tomo slice 37/73.0]
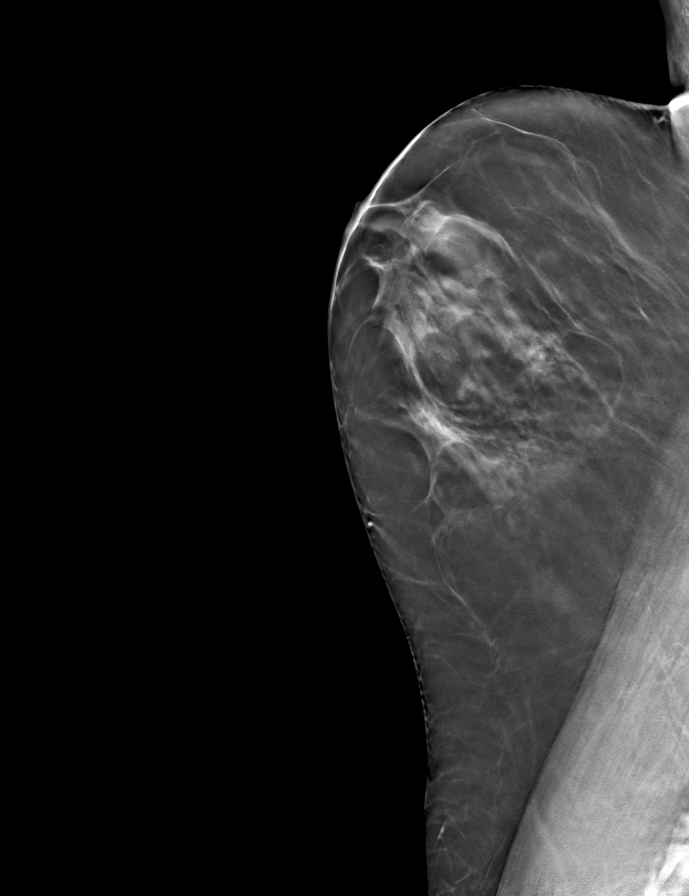

[R CC tomo · tomo slice 35/68.0]
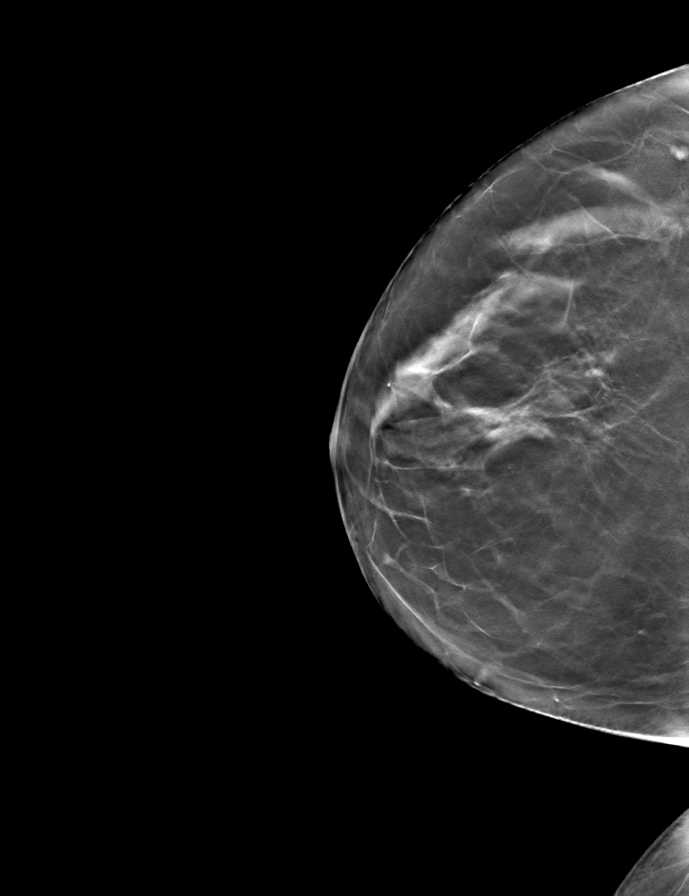

[L CC tomo · tomo slice 33/66.0]
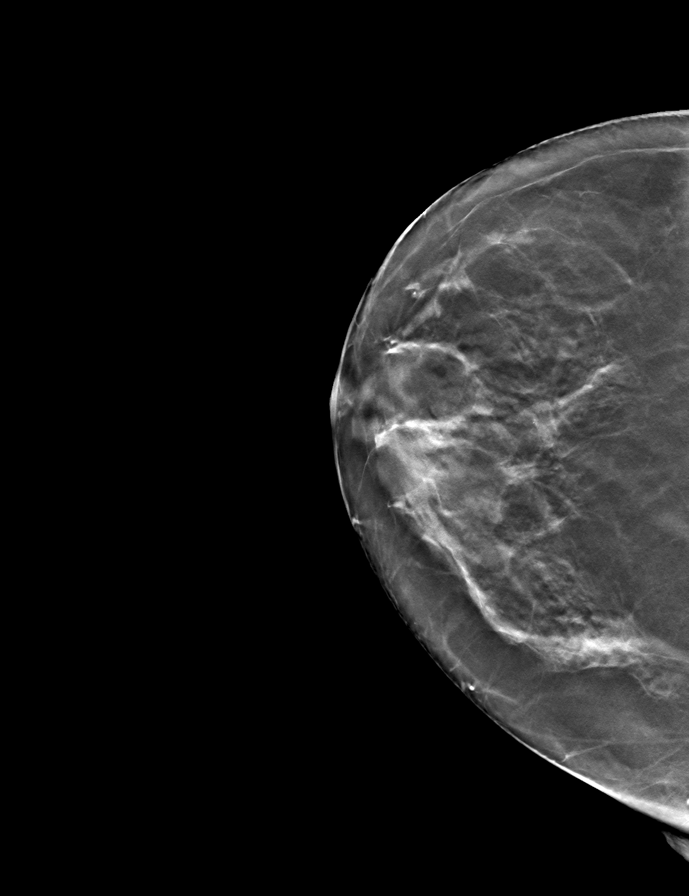

[9 of 24 positions shown; findings below may reference images not displayed]

ACR Breast Density Category c: The breast tissue is heterogeneously
dense, which may obscure small masses.
FINDINGS: There are no findings suspicious for malignancy.
IMPRESSION: No mammographic evidence of malignancy. A result letter of this
screening mammogram will be mailed directly to the patient.

RECOMMENDATION:
Screening mammogram in one year. (Code:[id number]-BC3)

BI-RADS CATEGORY  1: Negative.

## 2022-10-01 ENCOUNTER — Other Ambulatory Visit: Payer: Self-pay | Admitting: Family Medicine

## 2022-10-01 DIAGNOSIS — Z1231 Encounter for screening mammogram for malignant neoplasm of breast: Secondary | ICD-10-CM

## 2022-10-27 ENCOUNTER — Ambulatory Visit
Admission: RE | Admit: 2022-10-27 | Discharge: 2022-10-27 | Disposition: A | Discharge status: Home / Self Care | Payer: 59 | Source: Ambulatory Visit | Attending: Family Medicine | Admitting: Family Medicine

## 2022-10-27 DIAGNOSIS — Z1231 Encounter for screening mammogram for malignant neoplasm of breast: Secondary | ICD-10-CM | POA: Insufficient documentation

## 2023-02-22 ENCOUNTER — Ambulatory Visit: Payer: 59

## 2023-02-22 DIAGNOSIS — K640 First degree hemorrhoids: Secondary | ICD-10-CM | POA: Diagnosis not present

## 2023-02-22 DIAGNOSIS — Z1211 Encounter for screening for malignant neoplasm of colon: Secondary | ICD-10-CM

## 2023-02-22 DIAGNOSIS — Z83719 Family history of colon polyps, unspecified: Secondary | ICD-10-CM | POA: Diagnosis not present

## 2023-03-16 ENCOUNTER — Ambulatory Visit: Payer: 59 | Admitting: Dermatology

## 2023-04-13 ENCOUNTER — Ambulatory Visit (INDEPENDENT_AMBULATORY_CARE_PROVIDER_SITE_OTHER): Payer: 59 | Admitting: Dermatology

## 2023-04-13 VITALS — BP 97/62 | HR 66

## 2023-04-13 DIAGNOSIS — Z1283 Encounter for screening for malignant neoplasm of skin: Secondary | ICD-10-CM

## 2023-04-13 DIAGNOSIS — L821 Other seborrheic keratosis: Secondary | ICD-10-CM | POA: Diagnosis not present

## 2023-04-13 DIAGNOSIS — L570 Actinic keratosis: Secondary | ICD-10-CM | POA: Diagnosis not present

## 2023-04-13 DIAGNOSIS — L820 Inflamed seborrheic keratosis: Secondary | ICD-10-CM

## 2023-04-13 DIAGNOSIS — L578 Other skin changes due to chronic exposure to nonionizing radiation: Secondary | ICD-10-CM | POA: Diagnosis not present

## 2023-04-13 DIAGNOSIS — D229 Melanocytic nevi, unspecified: Secondary | ICD-10-CM

## 2023-04-13 DIAGNOSIS — L814 Other melanin hyperpigmentation: Secondary | ICD-10-CM | POA: Diagnosis not present

## 2023-04-13 NOTE — Patient Instructions (Addendum)
Actinic keratoses are precancerous spots that appear secondary to cumulative UV radiation exposure/sun exposure over time. They are chronic with expected duration over 1 year. A portion of actinic keratoses will progress to squamous cell carcinoma of the skin. It is not possible to reliably predict which spots will progress to skin cancer and so treatment is recommended to prevent development of skin cancer.  Recommend daily broad spectrum sunscreen SPF 30+ to sun-exposed areas, reapply every 2 hours as needed.  Recommend staying in the shade or wearing long sleeves, sun glasses (UVA+UVB protection) and wide brim hats (4-inch brim around the entire circumference of the hat). Call for new or changing lesions.   Cryotherapy Aftercare  Wash gently with soap and water everyday.   Apply Vaseline and Band-Aid daily until healed.    Seborrheic Keratosis  What causes seborrheic keratoses? Seborrheic keratoses are harmless, common skin growths that first appear during adult life.  As time goes by, more growths appear.  Some people may develop a large number of them.  Seborrheic keratoses appear on both covered and uncovered body parts.  They are not caused by sunlight.  The tendency to develop seborrheic keratoses can be inherited.  They vary in color from skin-colored to gray, brown, or even black.  They can be either smooth or have a rough, warty surface.   Seborrheic keratoses are superficial and look as if they were stuck on the skin.  Under the microscope this type of keratosis looks like layers upon layers of skin.  That is why at times the top layer may seem to fall off, but the rest of the growth remains and re-grows.    Treatment Seborrheic keratoses do not need to be treated, but can easily be removed in the office.  Seborrheic keratoses often cause symptoms when they rub on clothing or jewelry.  Lesions can be in the way of shaving.  If they become inflamed, they can cause itching, soreness, or  burning.  Removal of a seborrheic keratosis can be accomplished by freezing, burning, or surgery. If any spot bleeds, scabs, or grows rapidly, please return to have it checked, as these can be an indication of a skin cancer.  Melanoma ABCDEs  Melanoma is the most dangerous type of skin cancer, and is the leading cause of death from skin disease.  You are more likely to develop melanoma if you: Have light-colored skin, light-colored eyes, or red or blond hair Spend a lot of time in the sun Tan regularly, either outdoors or in a tanning bed Have had blistering sunburns, especially during childhood Have a close family member who has had a melanoma Have atypical moles or large birthmarks  Early detection of melanoma is key since treatment is typically straightforward and cure rates are extremely high if we catch it early.   The first sign of melanoma is often a change in a mole or a new dark spot.  The ABCDE system is a way of remembering the signs of melanoma.  A for asymmetry:  The two halves do not match. B for border:  The edges of the growth are irregular. C for color:  A mixture of colors are present instead of an even brown color. D for diameter:  Melanomas are usually (but not always) greater than 6mm - the size of a pencil eraser. E for evolution:  The spot keeps changing in size, shape, and color.  Please check your skin once per month between visits. You can use a small   mirror in front and a large mirror behind you to keep an eye on the back side or your body.   If you see any new or changing lesions before your next follow-up, please call to schedule a visit.  Please continue daily skin protection including broad spectrum sunscreen SPF 30+ to sun-exposed areas, reapplying every 2 hours as needed when you're outdoors.   Staying in the shade or wearing long sleeves, sun glasses (UVA+UVB protection) and wide brim hats (4-inch brim around the entire circumference of the hat) are also  recommended for sun protection.    Due to recent changes in healthcare laws, you may see results of your pathology and/or laboratory studies on MyChart before the doctors have had a chance to review them. We understand that in some cases there may be results that are confusing or concerning to you. Please understand that not all results are received at the same time and often the doctors may need to interpret multiple results in order to provide you with the best plan of care or course of treatment. Therefore, we ask that you please give us 2 business days to thoroughly review all your results before contacting the office for clarification. Should we see a critical lab result, you will be contacted sooner.   If You Need Anything After Your Visit  If you have any questions or concerns for your doctor, please call our main line at [phone] and press option 4 to reach your doctor's medical assistant. If no one answers, please leave a voicemail as directed and we will return your call as soon as possible. Messages left after 4 pm will be answered the following business day.   You may also send us a message via MyChart. We typically respond to MyChart messages within 1-2 business days.  For prescription refills, please ask your pharmacy to contact our office. Our fax number is [phone].  If you have an urgent issue when the clinic is closed that cannot wait until the next business day, you can page your doctor at the number below.    Please note that while we do our best to be available for urgent issues outside of office hours, we are not available 24/7.   If you have an urgent issue and are unable to reach us, you may choose to seek medical care at your doctor's office, retail clinic, urgent care center, or emergency room.  If you have a medical emergency, please immediately call 911 or go to the emergency department.  Pager Numbers  - Dr. Kowalski: [phone]  - Dr. Moye:  [phone]  - Dr. Stewart: [phone]  In the event of inclement weather, please call our main line at [phone] for an update on the status of any delays or closures.  Dermatology Medication Tips: Please keep the boxes that topical medications come in in order to help keep track of the instructions about where and how to use these. Pharmacies typically print the medication instructions only on the boxes and not directly on the medication tubes.   If your medication is too expensive, please contact our office at [phone] option 4 or send us a message through MyChart.   We are unable to tell what your co-pay for medications will be in advance as this is different depending on your insurance coverage. However, we may be able to find a substitute medication at lower cost or fill out paperwork to get insurance to cover a needed medication.   If a prior authorization   is required to get your medication covered by your insurance company, please allow us 1-2 business days to complete this process.  Drug prices often vary depending on where the prescription is filled and some pharmacies may offer cheaper prices.  The website www.goodrx.com contains coupons for medications through different pharmacies. The prices here do not account for what the cost may be with help from insurance (it may be cheaper with your insurance), but the website can give you the price if you did not use any insurance.  - You can print the associated coupon and take it with your prescription to the pharmacy.  - You may also stop by our office during regular business hours and pick up a GoodRx coupon card.  - If you need your prescription sent electronically to a different pharmacy, notify our office through [facility] MyChart or by phone at [phone] option 4.     Si Usted Necesita Algo Despus de Su Visita  Tambin puede enviarnos un mensaje a travs de MyChart. Por lo general respondemos a los mensajes de  MyChart en el transcurso de 1 a 2 das hbiles.  Para renovar recetas, por favor pida a su farmacia que se ponga en contacto con nuestra oficina. Nuestro nmero de fax es el [phone].  Si tiene un asunto urgente cuando la clnica est cerrada y que no puede esperar hasta el siguiente da hbil, puede llamar/localizar a su doctor(a) al nmero que aparece a continuacin.   Por favor, tenga en cuenta que aunque hacemos todo lo posible para estar disponibles para asuntos urgentes fuera del horario de oficina, no estamos disponibles las 24 horas del da, los 7 das de la semana.   Si tiene un problema urgente y no puede comunicarse con nosotros, puede optar por buscar atencin mdica  en el consultorio de su doctor(a), en una clnica privada, en un centro de atencin urgente o en una sala de emergencias.  Si tiene una emergencia mdica, por favor llame inmediatamente al 911 o vaya a la sala de emergencias.  Nmeros de bper  - Dr. Kowalski: [phone]  - Dra. Moye: [phone]  - Dra. Stewart: [phone]  En caso de inclemencias del tiempo, por favor llame a nuestra lnea principal al [phone] para una actualizacin sobre el estado de cualquier retraso o cierre.  Consejos para la medicacin en dermatologa: Por favor, guarde las cajas en las que vienen los medicamentos de uso tpico para ayudarle a seguir las instrucciones sobre dnde y cmo usarlos. Las farmacias generalmente imprimen las instrucciones del medicamento slo en las cajas y no directamente en los tubos del medicamento.   Si su medicamento es muy caro, por favor, pngase en contacto con nuestra oficina llamando al [phone] y presione la opcin 4 o envenos un mensaje a travs de MyChart.   No podemos decirle cul ser su copago por los medicamentos por adelantado ya que esto es diferente dependiendo de la cobertura de su seguro. Sin embargo, es posible que podamos encontrar un medicamento sustituto a menor costo o  llenar un formulario para que el seguro cubra el medicamento que se considera necesario.   Si se requiere una autorizacin previa para que su compaa de seguros cubra su medicamento, por favor permtanos de 1 a 2 das hbiles para completar este proceso.  Los precios de los medicamentos varan con frecuencia dependiendo del lugar de dnde se surte la receta y alguna farmacias pueden ofrecer precios ms baratos.  El sitio web www.goodrx.com tiene cupones para medicamentos   de diferentes farmacias. Los precios aqu no tienen en cuenta lo que podra costar con la ayuda del seguro (puede ser ms barato con su seguro), pero el sitio web puede darle el precio si no utiliz ningn seguro.  - Puede imprimir el cupn correspondiente y llevarlo con su receta a la farmacia.  - Tambin puede pasar por nuestra oficina durante el horario de atencin regular y recoger una tarjeta de cupones de GoodRx.  - Si necesita que su receta se enve electrnicamente a una farmacia diferente, informe a nuestra oficina a travs de MyChart de [place] o por telfono llamando al [phone] y presione la opcin 4.  

## 2023-04-13 NOTE — Progress Notes (Signed)
Follow-Up Visit   Subjective  Karina Williams is a 62 y.o. female who presents for the following: Skin Cancer Screening and Full Body Skin Exam Reports itchy spot at back  The patient presents for Total-Body Skin Exam (TBSE) for skin cancer screening and mole check. The patient has spots, moles and lesions to be evaluated, some may be new or changing and the patient has concerns that these could be cancer.  The following portions of the chart were reviewed this encounter and updated as appropriate: medications, allergies, medical history  Review of Systems:  No other skin or systemic complaints except as noted in HPI or Assessment and Plan.  Objective  Well appearing patient in no apparent distress; mood and affect are within normal limits.  A full examination was performed including scalp, head, eyes, ears, nose, lips, neck, chest, axillae, abdomen, back, buttocks, bilateral upper extremities, bilateral lower extremities, hands, feet, fingers, toes, fingernails, and toenails. All findings within normal limits unless otherwise noted below.   Relevant physical exam findings are noted in the Assessment and Plan.  nose x 1 Erythematous thin papules/macules with gritty scale.   left back x 2 (2) Erythematous stuck-on, waxy papule or plaque   Assessment & Plan   LENTIGINES, SEBORRHEIC KERATOSES, HEMANGIOMAS - Benign normal skin lesions - Benign-appearing - Call for any changes  MELANOCYTIC NEVI - Tan-brown and/or pink-flesh-colored symmetric macules and papules - Benign appearing on exam today - Observation - Call clinic for new or changing moles - Recommend daily use of broad spectrum spf 30+ sunscreen to sun-exposed areas.   ACTINIC DAMAGE - Chronic condition, secondary to cumulative UV/sun exposure - diffuse scaly erythematous macules with underlying dyspigmentation - Recommend daily broad spectrum sunscreen SPF 30+ to sun-exposed areas, reapply every 2 hours as needed.  -  Staying in the shade or wearing long sleeves, sun glasses (UVA+UVB protection) and wide brim hats (4-inch brim around the entire circumference of the hat) are also recommended for sun protection.  - Call for new or changing lesions.  SKIN CANCER SCREENING PERFORMED TODAY.  Actinic keratosis nose x 1  Actinic keratoses are precancerous spots that appear secondary to cumulative UV radiation exposure/sun exposure over time. They are chronic with expected duration over 1 year. A portion of actinic keratoses will progress to squamous cell carcinoma of the skin. It is not possible to reliably predict which spots will progress to skin cancer and so treatment is recommended to prevent development of skin cancer.  Recommend daily broad spectrum sunscreen SPF 30+ to sun-exposed areas, reapply every 2 hours as needed.  Recommend staying in the shade or wearing long sleeves, sun glasses (UVA+UVB protection) and wide brim hats (4-inch brim around the entire circumference of the hat). Call for new or changing lesions.  Destruction of lesion - nose x 1 Complexity: simple   Destruction method: cryotherapy   Informed consent: discussed and consent obtained   Timeout:  patient name, date of birth, surgical site, and procedure verified Lesion destroyed using liquid nitrogen: Yes   Region frozen until ice ball extended beyond lesion: Yes   Outcome: patient tolerated procedure well with no complications   Post-procedure details: wound care instructions given    Inflamed seborrheic keratosis (2) left back x 2  Symptomatic, irritating, patient would like treated.  Destruction of lesion - left back x 2 Complexity: simple   Destruction method: cryotherapy   Informed consent: discussed and consent obtained   Timeout:  patient name, date of birth, surgical  site, and procedure verified Lesion destroyed using liquid nitrogen: Yes   Region frozen until ice ball extended beyond lesion: Yes   Outcome: patient  tolerated procedure well with no complications   Post-procedure details: wound care instructions given     Return in about 1 year (around 04/12/2024) for TBSE.  IAsher Muir, CMA, am acting as scribe for Armida Sans, MD.   Documentation: I have reviewed the above documentation for accuracy and completeness, and I agree with the above.  Armida Sans, MD

## 2023-04-25 ENCOUNTER — Encounter: Payer: Self-pay | Admitting: Dermatology

## 2023-12-08 ENCOUNTER — Other Ambulatory Visit: Payer: Self-pay | Admitting: Family Medicine

## 2023-12-08 DIAGNOSIS — Z1231 Encounter for screening mammogram for malignant neoplasm of breast: Secondary | ICD-10-CM

## 2024-01-02 ENCOUNTER — Ambulatory Visit
Admission: RE | Admit: 2024-01-02 | Discharge: 2024-01-02 | Disposition: A | Discharge status: Home / Self Care | Payer: 59 | Source: Ambulatory Visit | Attending: Family Medicine | Admitting: Family Medicine

## 2024-01-02 DIAGNOSIS — Z1231 Encounter for screening mammogram for malignant neoplasm of breast: Secondary | ICD-10-CM | POA: Insufficient documentation

## 2024-04-25 ENCOUNTER — Encounter: Payer: Self-pay | Admitting: Dermatology

## 2024-04-25 ENCOUNTER — Ambulatory Visit: Payer: 59 | Admitting: Dermatology

## 2024-04-25 DIAGNOSIS — L821 Other seborrheic keratosis: Secondary | ICD-10-CM

## 2024-04-25 DIAGNOSIS — L578 Other skin changes due to chronic exposure to nonionizing radiation: Secondary | ICD-10-CM | POA: Diagnosis not present

## 2024-04-25 DIAGNOSIS — D229 Melanocytic nevi, unspecified: Secondary | ICD-10-CM

## 2024-04-25 DIAGNOSIS — D1801 Hemangioma of skin and subcutaneous tissue: Secondary | ICD-10-CM

## 2024-04-25 DIAGNOSIS — L814 Other melanin hyperpigmentation: Secondary | ICD-10-CM

## 2024-04-25 DIAGNOSIS — Z1283 Encounter for screening for malignant neoplasm of skin: Secondary | ICD-10-CM | POA: Diagnosis not present

## 2024-04-25 DIAGNOSIS — L570 Actinic keratosis: Secondary | ICD-10-CM

## 2024-04-25 DIAGNOSIS — W908XXA Exposure to other nonionizing radiation, initial encounter: Secondary | ICD-10-CM | POA: Diagnosis not present

## 2024-04-25 DIAGNOSIS — Z872 Personal history of diseases of the skin and subcutaneous tissue: Secondary | ICD-10-CM

## 2024-04-25 NOTE — Patient Instructions (Addendum)
 Cryotherapy Aftercare  Wash gently with soap and water everyday.   Apply Vaseline and Band-Aid daily until healed.   Insect Shield Insect Shield permethrin treated Occupational [email or web address]=&[email or web address]&source=[email or web address]=&[email or web address]  Due to recent changes in healthcare laws, you may see results of your pathology and/or laboratory studies on MyChart before the doctors have had a chance to review them. We understand that in some cases there may be results that are confusing or concerning to you. Please understand that not all results are received at the same time and often the doctors may need to interpret multiple results in order to provide you with the best plan of care or course of treatment. Therefore, we ask that you please give us  2 business days to thoroughly review all your results before contacting the office for clarification. Should we see a critical lab result, you will be contacted sooner.   If You Need Anything After Your Visit  If you have any questions or concerns for your doctor, please call our main line at [phone] and press option 4 to reach your doctor's medical assistant. If no one answers, please leave a voicemail as directed and we will return your call as soon as possible. Messages left after 4 pm will be answered the following business day.   You may also send us  a message via MyChart. We typically respond to MyChart messages within 1-2 business days.  For prescription refills, please ask your pharmacy to contact our office. Our fax number is [phone].  If you have an urgent issue when the clinic is closed that cannot wait until the next business day, you can page your doctor at the number below.    Please note that while we do our best to be available for urgent issues outside of office hours, we are not available 24/7.    If you have an urgent issue and are unable to reach us , you may choose to seek medical care at your doctor's office, retail clinic, urgent care center, or emergency room.  If you have a medical emergency, please immediately call 911 or go to the emergency department.  Pager Numbers  - Dr. Bary Likes: [phone]  - Dr. Annette Barters: [phone]  - Dr. Felipe Horton: [phone]   In the event of inclement weather, please call our main line at [phone] for an update on the status of any delays or closures.  Dermatology Medication Tips: Please keep the boxes that topical medications come in in order to help keep track of the instructions about where and how to use these. Pharmacies typically print the medication instructions only on the boxes and not directly on the medication tubes.   If your medication is too expensive, please contact our office at [phone] option 4 or send us  a message through MyChart.   We are unable to tell what your co-pay for medications will be in advance as this is different depending on your insurance coverage. However, we may be able to find a substitute medication at lower cost or fill out paperwork to get insurance to cover a needed medication.   If a prior authorization is required to get your medication covered by your insurance company, please allow us  1-2 business days to complete this process.  Drug prices often vary depending on where the prescription is filled and some pharmacies may offer cheaper prices.  The website www.goodrx.com contains coupons for medications through different pharmacies. The prices here do not account for what the cost may be with help from insurance (it may be  cheaper with your insurance), but the website can give you the price if you did not use any insurance.  - You can print the associated coupon and take it with your prescription to the pharmacy.  - You may also stop by our office during regular business hours and pick up a  GoodRx coupon card.  - If you need your prescription sent electronically to a different pharmacy, notify our office through [facility] or by phone at [phone] option 4.     Si Usted Necesita Algo Despus de Su Visita  Tambin puede enviarnos un mensaje a travs de Clinical cytogeneticist. Por lo general respondemos a los mensajes de MyChart en el transcurso de 1 a 2 das hbiles.  Para renovar recetas, por favor pida a su farmacia que se ponga en contacto con nuestra oficina. Franz Jacks de fax es [place] [phone].  Si tiene un asunto urgente cuando la clnica est cerrada y que no puede esperar hasta el siguiente da hbil, puede llamar/localizar a su doctor(a) al nmero que aparece a continuacin.   Por favor, tenga en cuenta que aunque hacemos todo lo posible para estar disponibles para asuntos urgentes fuera del horario de [place], no estamos disponibles las 24 horas del da, los 7 [address]  [address] de la [place].   Si tiene un problema urgente y no puede comunicarse con nosotros, puede optar por buscar atencin mdica  en el consultorio de su doctor(a), en una clnica privada, en un centro de atencin urgente o en una sala de emergencias.  Si tiene Engineer, drilling, por favor llame inmediatamente al 911 o vaya a la sala de emergencias.  Nmeros de bper  - Dr. Bary Likes: [phone]  - Dra. Annette Barters: [phone]  - Dr. Felipe Horton: [phone]   En caso de inclemencias del tiempo, por favor llame a Lajuan Pila principal al [phone] para una actualizacin sobre el [place] de cualquier retraso o cierre.  Consejos para la medicacin en dermatologa: Por favor, guarde las cajas en las que vienen los medicamentos de uso tpico para ayudarle a seguir las instrucciones sobre dnde y cmo usarlos. Las farmacias generalmente imprimen las instrucciones del medicamento slo en las cajas y no directamente en los tubos del [place].   Si su medicamento es muy caro, por favor, pngase en contacto  con Bettyjane Brunet llamando al [phone] y presione la opcin 4 o envenos un mensaje a travs de Clinical cytogeneticist.   No podemos decirle cul ser su copago por los medicamentos por adelantado ya que esto es diferente dependiendo de la cobertura de su seguro. Sin embargo, es posible que podamos encontrar un medicamento sustituto a Audiological scientist un formulario para que el seguro cubra el medicamento que se considera necesario.   Si se requiere una autorizacin previa para que su compaa de seguros [place] su medicamento, por favor permtanos de 1 a 2 das hbiles para completar este proceso.  Los precios de los medicamentos varan con frecuencia dependiendo del Environmental consultant de dnde se surte la receta y alguna farmacias pueden ofrecer precios ms baratos.  El sitio web www.goodrx.com tiene cupones para medicamentos de Health and safety inspector. Los precios aqu no tienen en cuenta lo que podra costar con la ayuda del seguro (puede ser ms barato con su seguro), pero el sitio web puede darle el precio si no utiliz Tourist information centre manager.  - Puede imprimir el cupn correspondiente y llevarlo con su receta a la farmacia.  - Tambin puede pasar por nuestra oficina durante el horario de atencin regular y  recoger una tarjeta de cupones de GoodRx.  - Si necesita que su receta se enve electrnicamente a una farmacia diferente, informe a nuestra oficina a travs de MyChart de [place] o por telfono llamando al [phone] y presione la opcin 4.

## 2024-04-25 NOTE — Progress Notes (Signed)
   Follow-Up Visit   Subjective  Karina Williams is a 63 y.o. female who presents for the following: Skin Cancer Screening and Full Body Skin Exam, hx of AKS, check spot R lower leg  The patient presents for Total-Body Skin Exam (TBSE) for skin cancer screening and mole check. The patient has spots, moles and lesions to be evaluated, some may be new or changing and the patient may have concern these could be cancer.    The following portions of the chart were reviewed this encounter and updated as appropriate: medications, allergies, medical history  Review of Systems:  No other skin or systemic complaints except as noted in HPI or Assessment and Plan.  Objective  Well appearing patient in no apparent distress; mood and affect are within normal limits.  A full examination was performed including scalp, head, eyes, ears, nose, lips, neck, chest, axillae, abdomen, back, buttocks, bilateral upper extremities, bilateral lower extremities, hands, feet, fingers, toes, fingernails, and toenails. All findings within normal limits unless otherwise noted below.   Relevant physical exam findings are noted in the Assessment and Plan.  forehead x 1 Pink scaly macules  Assessment & Plan   SKIN CANCER SCREENING PERFORMED TODAY.  ACTINIC DAMAGE - Chronic condition, secondary to cumulative UV/sun exposure - diffuse scaly erythematous macules with underlying dyspigmentation - Recommend daily broad spectrum sunscreen SPF 30+ to sun-exposed areas, reapply every 2 hours as needed.  - Staying in the shade or wearing long sleeves, sun glasses (UVA+UVB protection) and wide brim hats (4-inch brim around the entire circumference of the hat) are also recommended for sun protection.  - Call for new or changing lesions.  LENTIGINES, SEBORRHEIC KERATOSES, HEMANGIOMAS - Benign normal skin lesions - Benign-appearing - Call for any changes - SK R lower leg  MELANOCYTIC NEVI - Tan-brown and/or  pink-flesh-colored symmetric macules and papules - Benign appearing on exam today - Observation - Call clinic for new or changing moles - Recommend daily use of broad spectrum spf 30+ sunscreen to sun-exposed areas.  AK (ACTINIC KERATOSIS) forehead x 1 Actinic keratoses are precancerous spots that appear secondary to cumulative UV radiation exposure/sun exposure over time. They are chronic with expected duration over 1 year. A portion of actinic keratoses will progress to squamous cell carcinoma of the skin. It is not possible to reliably predict which spots will progress to skin cancer and so treatment is recommended to prevent development of skin cancer.  Recommend daily broad spectrum sunscreen SPF 30+ to sun-exposed areas, reapply every 2 hours as needed.  Recommend staying in the shade or wearing long sleeves, sun glasses (UVA+UVB protection) and wide brim hats (4-inch brim around the entire circumference of the hat). Call for new or changing lesions. Destruction of lesion - forehead x 1 Complexity: simple   Destruction method: cryotherapy   Informed consent: discussed and consent obtained   Timeout:  patient name, date of birth, surgical site, and procedure verified Lesion destroyed using liquid nitrogen: Yes   Region frozen until ice ball extended beyond lesion: Yes   Outcome: patient tolerated procedure well with no complications   Post-procedure details: wound care instructions given    Return in about 1 year (around 04/25/2025) for TBSE, Hx of AKs.  I, Rollie Clipper, RMA, am acting as scribe for Celine Collard, MD .   Documentation: I have reviewed the above documentation for accuracy and completeness, and I agree with the above.  Celine Collard, MD

## 2024-04-26 ENCOUNTER — Encounter: Payer: Self-pay | Admitting: Dermatology

## 2025-01-04 ENCOUNTER — Other Ambulatory Visit: Payer: Self-pay | Admitting: Family Medicine

## 2025-01-04 DIAGNOSIS — Z1231 Encounter for screening mammogram for malignant neoplasm of breast: Secondary | ICD-10-CM

## 2025-01-23 ENCOUNTER — Ambulatory Visit
Admission: RE | Admit: 2025-01-23 | Discharge: 2025-01-23 | Disposition: A | Discharge status: Home / Self Care | Source: Ambulatory Visit | Attending: Family Medicine | Admitting: Family Medicine

## 2025-01-23 DIAGNOSIS — Z1231 Encounter for screening mammogram for malignant neoplasm of breast: Secondary | ICD-10-CM | POA: Insufficient documentation

## 2025-04-25 ENCOUNTER — Ambulatory Visit: Admitting: Dermatology
